# Patient Record
Sex: Male | Born: 1980 | Race: White | Hispanic: No | Marital: Married | Smoking: Never smoker
Health system: Southern US, Community
[De-identification: ages and names within clinical notes are randomized; demographics above are authoritative.]

## PROBLEM LIST (undated history)

## (undated) DIAGNOSIS — F410 Panic disorder [episodic paroxysmal anxiety] without agoraphobia: Secondary | ICD-10-CM

## (undated) HISTORY — PX: BACK SURGERY: SHX140

## (undated) HISTORY — PX: HERNIA REPAIR: SHX51

## (undated) HISTORY — PX: OTHER SURGICAL HISTORY: SHX169

---

## 2016-06-11 ENCOUNTER — Encounter: Payer: Self-pay | Admitting: Emergency Medicine

## 2016-06-11 ENCOUNTER — Emergency Department: Payer: BLUE CROSS/BLUE SHIELD

## 2016-06-11 ENCOUNTER — Emergency Department
Admission: EM | Admit: 2016-06-11 | Discharge: 2016-06-11 | Disposition: A | Discharge status: Home / Self Care | Payer: BLUE CROSS/BLUE SHIELD | Attending: Emergency Medicine | Admitting: Emergency Medicine

## 2016-06-11 DIAGNOSIS — R1032 Left lower quadrant pain: Secondary | ICD-10-CM | POA: Insufficient documentation

## 2016-06-11 HISTORY — DX: Panic disorder (episodic paroxysmal anxiety): F41.0

## 2016-06-11 LAB — URINALYSIS, COMPLETE (UACMP) WITH MICROSCOPIC
BILIRUBIN URINE: NEGATIVE
Bacteria, UA: NONE SEEN
GLUCOSE, UA: NEGATIVE mg/dL
Hgb urine dipstick: NEGATIVE
Ketones, ur: NEGATIVE mg/dL
Leukocytes, UA: NEGATIVE
Nitrite: NEGATIVE
PROTEIN: NEGATIVE mg/dL
SQUAMOUS EPITHELIAL / LPF: NONE SEEN
Specific Gravity, Urine: 1.012 (ref 1.005–1.030)
pH: 7 (ref 5.0–8.0)

## 2016-06-11 LAB — CBC
HEMATOCRIT: 43.3 % (ref 40.0–52.0)
Hemoglobin: 15 g/dL (ref 13.0–18.0)
MCH: 31.9 pg (ref 26.0–34.0)
MCHC: 34.7 g/dL (ref 32.0–36.0)
MCV: 91.8 fL (ref 80.0–100.0)
PLATELETS: 169 10*3/uL (ref 150–440)
RBC: 4.72 MIL/uL (ref 4.40–5.90)
RDW: 12.2 % (ref 11.5–14.5)
WBC: 7.4 10*3/uL (ref 3.8–10.6)

## 2016-06-11 LAB — COMPREHENSIVE METABOLIC PANEL
ALT: 24 U/L (ref 17–63)
AST: 22 U/L (ref 15–41)
Albumin: 4.1 g/dL (ref 3.5–5.0)
Alkaline Phosphatase: 46 U/L (ref 38–126)
Anion gap: 7 (ref 5–15)
BUN: 18 mg/dL (ref 6–20)
CHLORIDE: 107 mmol/L (ref 101–111)
CO2: 26 mmol/L (ref 22–32)
CREATININE: 0.86 mg/dL (ref 0.61–1.24)
Calcium: 9.2 mg/dL (ref 8.9–10.3)
GFR calc non Af Amer: >60 mL/min (ref >60)
Glucose, Bld: 103 mg/dL — ABNORMAL HIGH (ref 65–99)
POTASSIUM: 3.8 mmol/L (ref 3.5–5.1)
SODIUM: 140 mmol/L (ref 135–145)
Total Bilirubin: 0.7 mg/dL (ref 0.3–1.2)
Total Protein: 6.9 g/dL (ref 6.5–8.1)

## 2016-06-11 LAB — LIPASE, BLOOD: LIPASE: 38 U/L (ref 11–51)

## 2016-06-11 MED ORDER — IOPAMIDOL (ISOVUE-300) INJECTION 61%
30.0000 mL | Freq: Once | INTRAVENOUS | Status: AC
  Administered 2016-06-11: 30 mL via ORAL

## 2016-06-11 MED ORDER — IOPAMIDOL (ISOVUE-300) INJECTION 61%
100.0000 mL | Freq: Once | INTRAVENOUS | Status: AC | PRN
  Administered 2016-06-11: 100 mL via INTRAVENOUS

## 2016-06-11 MED ORDER — KETOROLAC TROMETHAMINE 30 MG/ML IJ SOLN
INTRAMUSCULAR | Status: AC
  Administered 2016-06-11: 30 mg via INTRAVENOUS
  Filled 2016-06-11: qty 1

## 2016-06-11 MED ORDER — KETOROLAC TROMETHAMINE 30 MG/ML IJ SOLN
30.0000 mg | Freq: Once | INTRAMUSCULAR | Status: AC
  Administered 2016-06-11: 30 mg via INTRAVENOUS

## 2016-06-11 MED ORDER — MORPHINE SULFATE (PF) 4 MG/ML IV SOLN
4.0000 mg | Freq: Once | INTRAVENOUS | Status: DC
  Filled 2016-06-11: qty 1

## 2016-06-11 MED ORDER — ONDANSETRON HCL 4 MG/2ML IJ SOLN
4.0000 mg | Freq: Once | INTRAMUSCULAR | Status: DC
  Filled 2016-06-11: qty 2

## 2016-06-11 NOTE — Discharge Instructions (Signed)
You were evaluated for abdominal pain and although no certain cause was found your exam and evaluation are reassuring today in the ER.  Your CT scan showed evidence of inflammation along the colon which might be resolving epiploic appendage otitis versus colitis.  Treat pain at home with over the counter ibuprofen, 600 mg 3 times daily as needed for pain. He should take with food. Do not take longer than 5-7 days because this can be rough on your stomach.  Return to the emergency department immediately for any fever, worsening abdominal pain, black or bloody stool, vomiting, or any other symptoms concerning to you.

## 2016-06-11 NOTE — ED Notes (Addendum)
Patient states he did not want to take morphine d/t previous bad experience with nausea/vomiting.  MD notified.

## 2016-06-11 NOTE — ED Notes (Addendum)
Patient recalls multiple incidents where he was playing with his kids over the last couple of weeks that could have caused an injury but specifically he said tonight his younger son jumped on him from the front twice and during dinner tonight he felt this pain get worse.  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

## 2016-06-11 NOTE — ED Triage Notes (Signed)
Patient reports having left lower quad pain since 10 pm.  Describes pain "like a catch in my side."

## 2016-06-11 NOTE — ED Provider Notes (Signed)
[facility]  I accepted care from Dr. Zenda Alpers ____________________________________________    LABS (pertinent positives/negatives)  Labs Reviewed  COMPREHENSIVE METABOLIC PANEL - Abnormal; Notable for the following:       Result Value   Glucose, Bld 103 (*)    All other components within normal limits  URINALYSIS, COMPLETE (UACMP) WITH MICROSCOPIC - Abnormal; Notable for the following:    Color, Urine YELLOW (*)    APPearance CLEAR (*)    All other components within normal limits  LIPASE, BLOOD  CBC     ____________________________________________    RADIOLOGY All xrays were viewed by me. Imaging interpreted by radiologist.  CT abd/ pel w: IMPRESSION: There is a focal area of mesenteric thickening in the lateral left upper pelvis. There is no associated bowel wall thickening or bowel wall inflammation. This finding may represent residua of nonspecific localized colitis in this area or residua of epiploic appendagitis. Inflamed epiploic appendage not seen currently. Appearance does not appear consistent with frank diverticulitis. There are scattered left colonic diverticular without diverticular inflammation evident. No abscess. No abnormal fluid collection. No free air.  L5-S1 disc extrusion on the left. Disc degeneration L5-S1.  No bowel obstruction. No ascites. No renal or ureteral calculus. No hydronephrosis. Appendix appears normal.  ____________________________________________   PROCEDURES  Procedure(s) performed: None  Critical Care performed: None  ____________________________________________   INITIAL IMPRESSION / ASSESSMENT AND PLAN / ED COURSE   Pertinent labs & imaging results that were available during my care of the patient were reviewed by me and considered in my medical decision making (see chart for details).  Accepted care,awaiting CT for lower abdominal pain.  CT shows nonspecific inflammation, Possible residual  after resolving epiploic appendagitis vs. Resolving colitis.  Given he is well-appearing, with reassuring laboratory studies, and no serious findings on CT at this point, I am going to treat conservatively with antibiotic or ibuprofen. We discussed return precautions at length. He felt comfortable with this plan. I have referred for clinical clinic follow-up.  CONSULTATIONS: None    Patient / Family / Caregiver informed of clinical course, medical decision-making process, and agree with plan.   I discussed return precautions, follow-up instructions, and discharged instructions with patient and/or family.    Discharge instructions:  You were evaluated for abdominal pain and although no certain cause was found your exam and evaluation are reassuring today in the ER.  Your CT scan showed evidence of inflammation along the colon which might be resolving epiploic appendage otitis versus colitis.  Treat pain at home with over the counter ibuprofen, 600 mg 3 times daily as needed for pain. He should take with food. Do not take longer than 5-7 days because this can be rough on your stomach.  Return to the emergency department immediately for any fever, worsening abdominal pain, black or bloody stool, vomiting, or any other symptoms concerning to you.  ____________________________________________   FINAL CLINICAL IMPRESSION(S) / ED DIAGNOSES  Final diagnoses:  Left lower quadrant pain        Governor Rooks, MD 06/11/16 [phone]

## 2016-06-11 NOTE — ED Notes (Signed)
CT notified patient finished with contrast 

## 2016-06-11 NOTE — ED Provider Notes (Signed)
[facility] Emergency Department Provider Note   ____________________________________________   First MD Initiated Contact with Patient 06/11/16 0602     (approximate)  I have reviewed the triage vital signs and the nursing notes.   HISTORY  Chief Complaint Abdominal Pain    HPI Ricardo Martinez is a 36 y.o. male who comes into the hospital today with some sharp pain in his left side. He reports that the pain started around 10 PM last night. He reports that he was at dinner and it was not strong. He is staying with his in-laws at this time. He states that the pain woke him up around 3 AM. He is visiting from out of town. The patient didn't take anything for pain but reports it has been constant and persistent. The patient rates his pain a 6-7 out of 10 in intensity. He denies any nausea or vomiting, pain with urination or blood in his urine. The patient has not had any fevers and reports that he has never had anything like this before. The patient's last bowel movement was here in the emergency department approximately 15 minutes prior to my coming into his room. There was no blood in his urine at the time. The patient is here today for evaluation of this pain.   Past Medical History:  Diagnosis Date  . Panic attack     There are no active problems to display for this patient.   Past Surgical History:  Procedure Laterality Date  . arm surgery Right   . BACK SURGERY    . HERNIA REPAIR      Prior to Admission medications   Not on File    Allergies Patient has no known allergies.  No family history on file.  Social History Social History  Substance Use Topics  . Smoking status: Never Smoker  . Smokeless tobacco: Not on file  . Alcohol use No    Review of Systems  Constitutional: No fever/chills Eyes: No visual changes. ENT: No sore throat. Cardiovascular: Denies chest pain. Respiratory: Denies shortness of breath. Gastrointestinal:  No abdominal pain.  No nausea, no vomiting.  No diarrhea.  No constipation. Genitourinary: Negative for dysuria. Musculoskeletal: Negative for back pain. Skin: Negative for rash. Neurological: Negative for headaches, focal weakness or numbness.   ____________________________________________   PHYSICAL EXAM:  VITAL SIGNS: ED Triage Vitals [06/11/16 0336]  Enc Vitals Group     BP 113/76     Pulse Rate 93     Resp 18     Temp 98.3 F (36.8 C)     Temp Source Oral     SpO2 96 %     Weight      Height      Head Circumference      Peak Flow      Pain Score 6     Pain Loc      Pain Edu?      Excl. in GC?     Constitutional: Alert and oriented. Well appearing and in moderate distress. Eyes: Conjunctivae are normal. PERRL. EOMI. Head: Atraumatic. Nose: No congestion/rhinnorhea. Mouth/Throat: Mucous membranes are moist.  Oropharynx non-erythematous. Cardiovascular: Normal rate, regular rhythm. Grossly normal heart sounds.  Good peripheral circulation. Respiratory: Normal respiratory effort.  No retractions. Lungs CTAB. Gastrointestinal: Soft with left lower quadrant tenderness to palpation. No distention. Positive bowel sounds Musculoskeletal: No lower extremity tenderness nor edema.   Neurologic:  Normal speech and language.  Skin:  Skin is warm, dry and  intact.  Psychiatric: Mood and affect are normal.  ____________________________________________   LABS (all labs ordered are listed, but only abnormal results are displayed)  Labs Reviewed  COMPREHENSIVE METABOLIC PANEL - Abnormal; Notable for the following:       Result Value   Glucose, Bld 103 (*)    All other components within normal limits  URINALYSIS, COMPLETE (UACMP) WITH MICROSCOPIC - Abnormal; Notable for the following:    Color, Urine YELLOW (*)    APPearance CLEAR (*)    All other components within normal limits  LIPASE, BLOOD  CBC    ____________________________________________  EKG  none ____________________________________________  RADIOLOGY  CT abd and pelvis ____________________________________________   PROCEDURES  Procedure(s) performed: None  Procedures  Critical Care performed: No  ____________________________________________   INITIAL IMPRESSION / ASSESSMENT AND PLAN / ED COURSE  Pertinent labs & imaging results that were available during my care of the patient were reviewed by me and considered in my medical decision making (see chart for details).  This is a 36 year old male who comes into the hospital today with abdominal pain. I will send the patient for a CT scan to evaluate for possible colitis versus diverticulitis. The patient reports that he does not want any morphine at this time. I will reassess the patient once I received the results of his imaging studies.     The patient's care will be signed out to Dr. Shaune Pollack will follow-up the results of the patient's CT scan and disposition the patient. ____________________________________________   FINAL CLINICAL IMPRESSION(S) / ED DIAGNOSES  Final diagnoses:  Left lower quadrant pain      NEW MEDICATIONS STARTED DURING THIS VISIT:  New Prescriptions   No medications on file     Note:  This document was prepared using Dragon voice recognition software and may include unintentional dictation errors.    Rebecka Apley, MD 06/11/16 [phone]

## 2017-11-05 IMAGING — CT CT ABD-PELV W/ CM
2 of 4 series · 14 of 46 positions shown, 16 images · IV contrast (APPLIED)
Comparison: None.

CLINICAL DATA: Lower abdominal pain for approximately 1 week

EXAM:
CT ABDOMEN AND PELVIS WITH CONTRAST
TECHNIQUE: Multidetector CT imaging of the abdomen and pelvis was performed
using the standard protocol following bolus administration of
intravenous contrast. Oral contrast also administered.
CONTRAST:  100mL 7GWTCD-PWW IOPAMIDOL (7GWTCD-PWW) INJECTION 61%

[Series 2: routine abd/pel with · axial · 0.82mm/px · z∈[-1055,-635]mm · 11 of 102 slices shown, 13 images]
[im 9/102  soft-tissue]
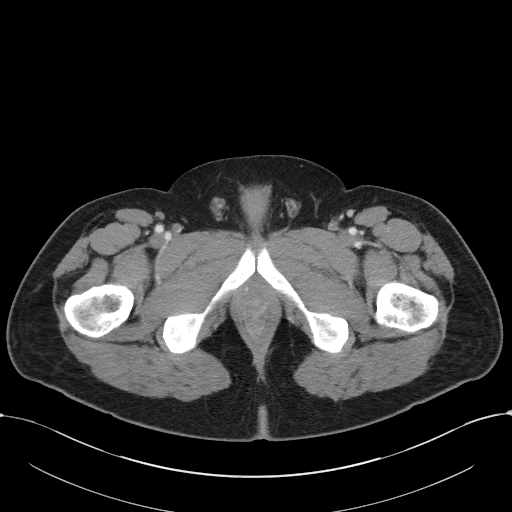
[im 9/102  bone]
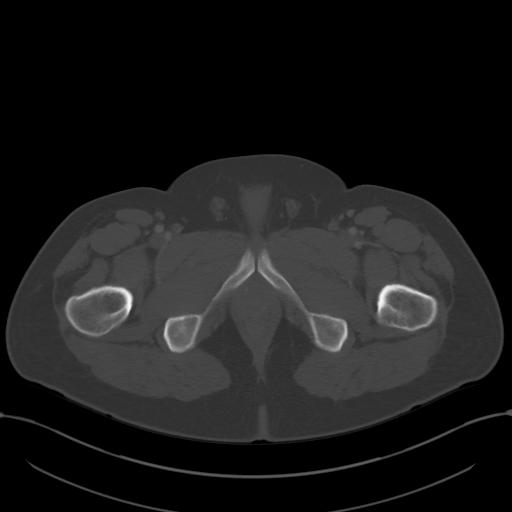
[im 17/102  soft-tissue]
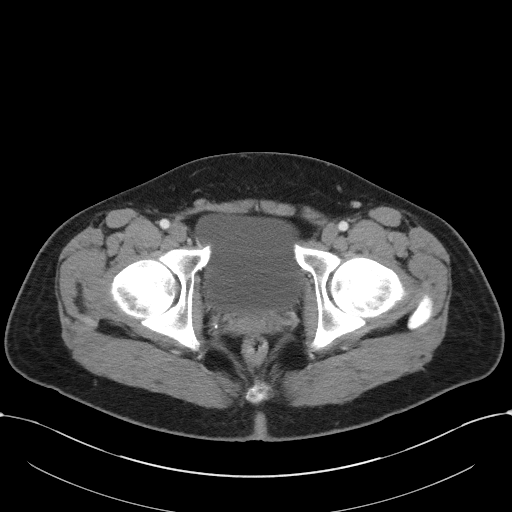
[im 26/102  soft-tissue]
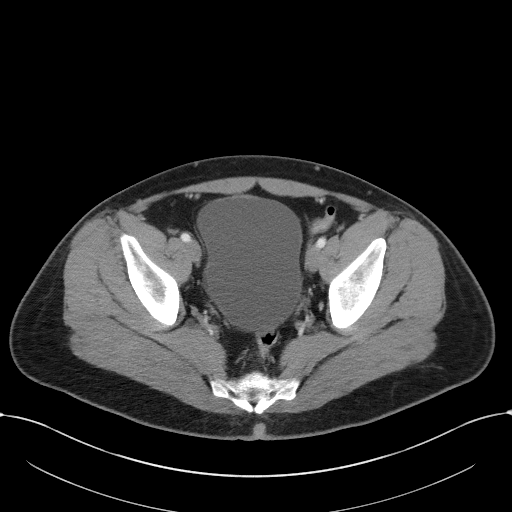
[im 34/102  soft-tissue]
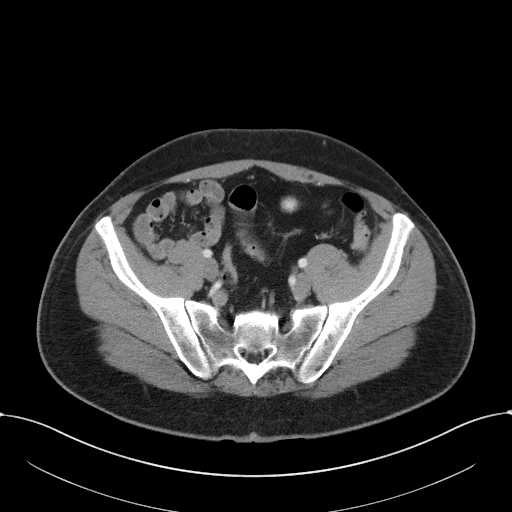
[im 43/102  soft-tissue]
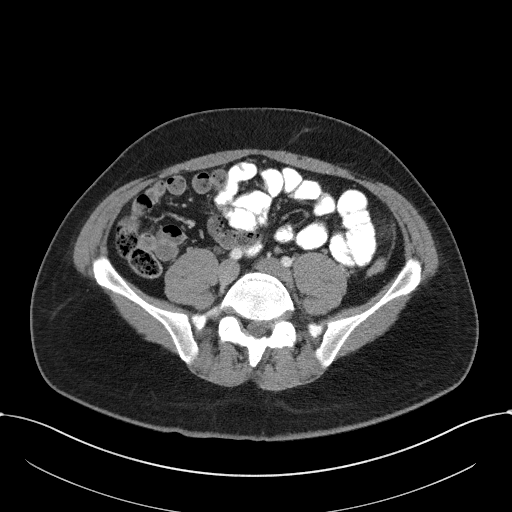
[im 51/102  soft-tissue]
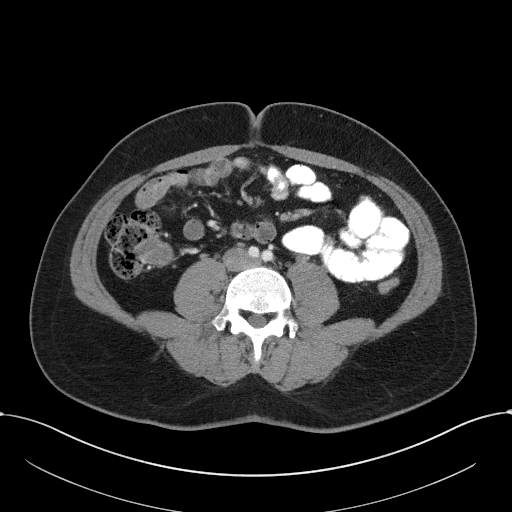
[im 59/102  soft-tissue]
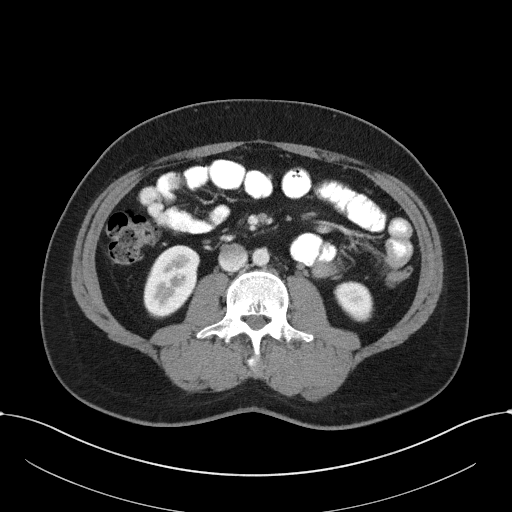
[im 68/102  soft-tissue]
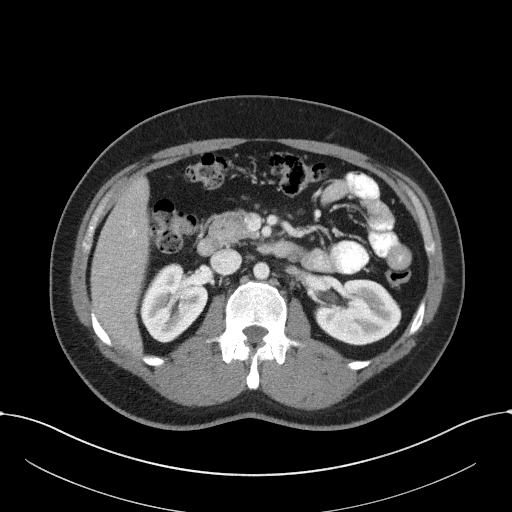
[im 76/102  soft-tissue]
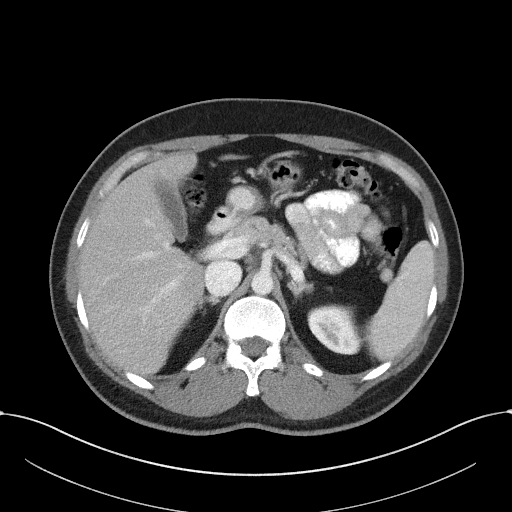
[im 76/102  bone]
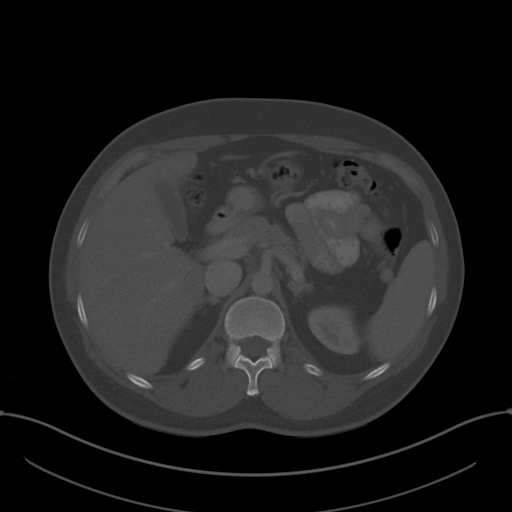
[im 85/102  soft-tissue]
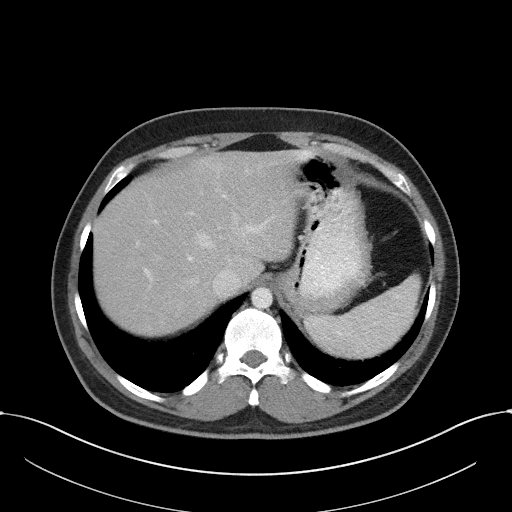
[im 93/102  soft-tissue]
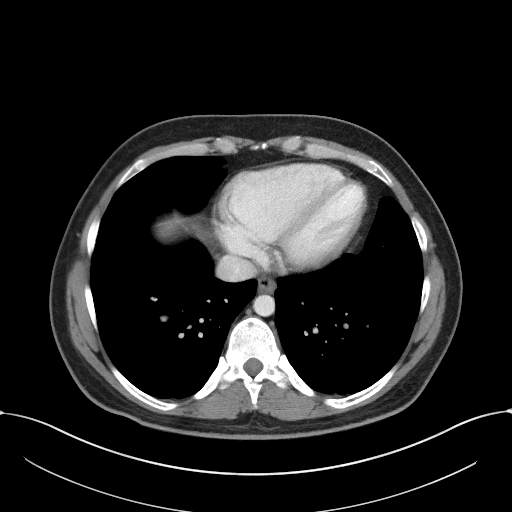

[Series 5: coronal st · coronal · 0.69mm/px · 3 of 98 slices shown]
[im 33/98  soft-tissue]
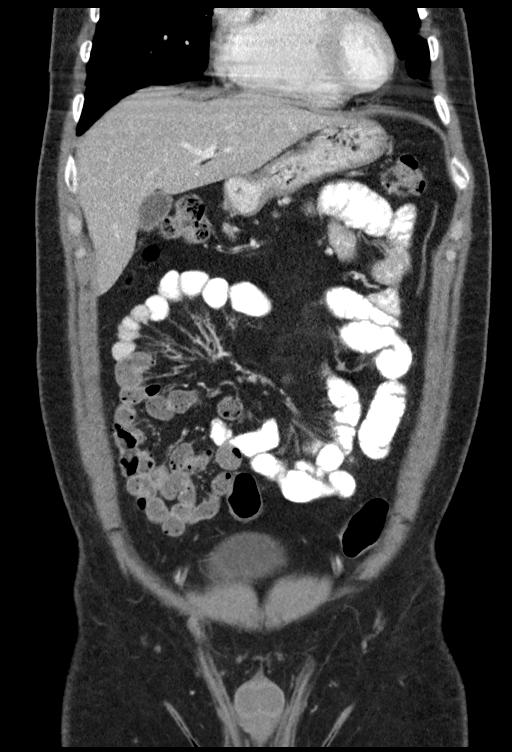
[im 44/98  soft-tissue]
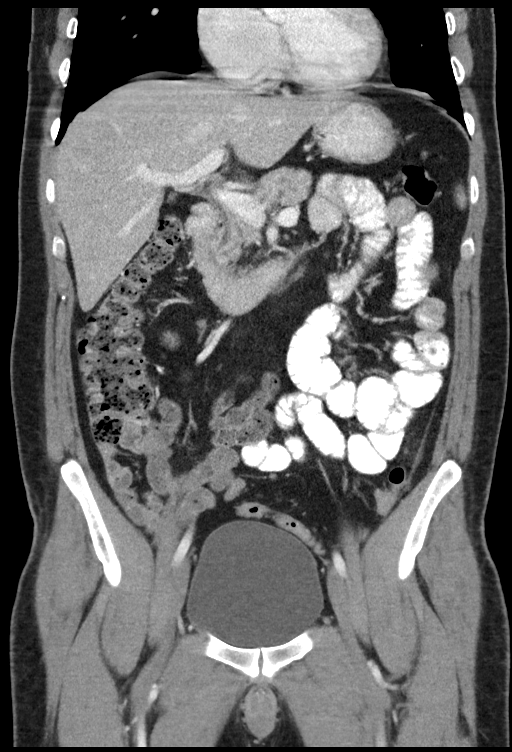
[im 54/98  soft-tissue]
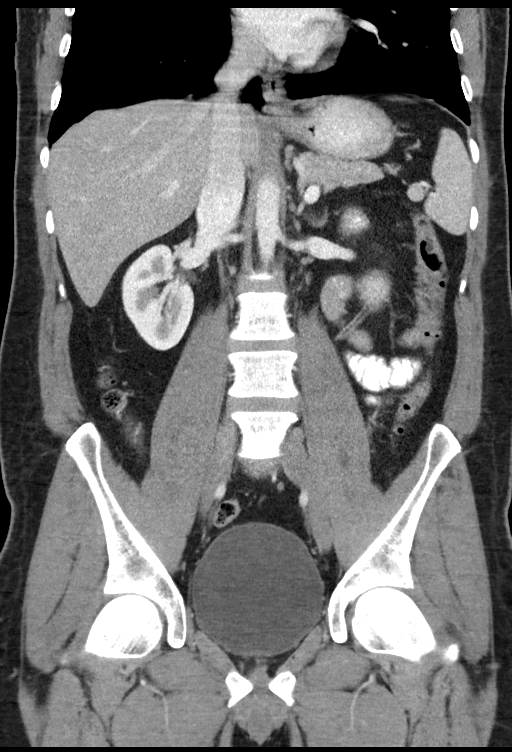

[14 of 46 positions shown; findings below may reference images not displayed]

FINDINGS: Lower chest: There is bibasilar atelectasis. No edema or
consolidation in the bases.

Hepatobiliary: There is a degree of hepatic steatosis. No focal
liver lesions are apparent. Gallbladder wall is not appreciably
thickened. There is no biliary duct dilatation.

Pancreas: No pancreatic mass or inflammatory focus.

Spleen: No splenic lesions are evident.

Adrenals/Urinary Tract: Adrenals appear normal bilaterally. There is
a cyst in the lower pole of the right kidney measuring 1.3 x 1.1 cm.
There is a cyst in the mid left kidney measuring 1.8 x 1.6 cm. There
is no hydronephrosis on either side. There is no evident renal or
ureteral calculus on either side. Urinary bladder is midline with
wall thickness within normal limits.

Stomach/Bowel: There is no bowel wall thickening. There is slight
thickening of the mesentery in the upper left pelvis laterally.
There is no appreciable bowel immediately adjacent to this
inflammation. No other mesenteric thickening. There are scattered
left-sided colonic diverticula without diverticulitis evident. There
is no bowel obstruction. No free air or portal venous air.

Vascular/Lymphatic: There is no abdominal aortic aneurysm. No
vascular lesions are evident. There is no appreciable adenopathy in
the abdomen or pelvis.

Reproductive: Prostate and seminal vesicles appear normal in size
and contour. No pelvic mass.

Other: Appendix appears normal. No ascites or abscess is evident in
the abdomen or pelvis.

Musculoskeletal: There is degenerative change with vacuum phenomenon
at L5-S1. There is disc extrusion on the left at L5-S1 with an
apparent is fragment extending slightly inferiorly at L5-S1 on the
left. There are no blastic or lytic bone lesions. No intramuscular
or abdominal wall lesions.
IMPRESSION: There is a focal area of mesenteric thickening in the lateral left
upper pelvis. There is no associated bowel wall thickening or bowel
wall inflammation. This finding may represent residua of nonspecific
localized colitis in this area or residua of epiploic appendagitis.
Inflamed epiploic appendage not seen currently. Appearance does not
appear consistent with frank diverticulitis. There are scattered
left colonic diverticular without diverticular inflammation evident.
No abscess. No abnormal fluid collection. No free air.

L5-S1 disc extrusion on the left.  Disc degeneration L5-S1.

No bowel obstruction. No ascites. No renal or ureteral calculus. No
hydronephrosis. Appendix appears normal.
